# Patient Record
Sex: Female | Born: 1950 | Race: Black or African American | Hispanic: No | Marital: Single | Smoking: Current every day smoker
Health system: Southern US, Community
[De-identification: ages and names within clinical notes are randomized; demographics above are authoritative.]

## PROBLEM LIST (undated history)

## (undated) DIAGNOSIS — I10 Essential (primary) hypertension: Secondary | ICD-10-CM

---

## 2000-09-30 HISTORY — PX: COLONOSCOPY: SHX174

## 2013-12-03 ENCOUNTER — Other Ambulatory Visit: Payer: Self-pay

## 2013-12-03 DIAGNOSIS — Z1231 Encounter for screening mammogram for malignant neoplasm of breast: Secondary | ICD-10-CM

## 2014-02-03 ENCOUNTER — Ambulatory Visit
Admission: RE | Admit: 2014-02-03 | Discharge: 2014-02-03 | Disposition: A | Discharge status: Home / Self Care | Payer: 59 | Source: Ambulatory Visit

## 2014-02-03 DIAGNOSIS — Z1231 Encounter for screening mammogram for malignant neoplasm of breast: Secondary | ICD-10-CM

## 2014-03-25 ENCOUNTER — Emergency Department (HOSPITAL_BASED_OUTPATIENT_CLINIC_OR_DEPARTMENT_OTHER): Payer: 59

## 2014-03-25 ENCOUNTER — Emergency Department (HOSPITAL_BASED_OUTPATIENT_CLINIC_OR_DEPARTMENT_OTHER)
Admission: EM | Admit: 2014-03-25 | Discharge: 2014-03-25 | Disposition: A | Discharge status: Home / Self Care | Payer: 59 | Attending: Emergency Medicine | Admitting: Emergency Medicine

## 2014-03-25 ENCOUNTER — Encounter (HOSPITAL_BASED_OUTPATIENT_CLINIC_OR_DEPARTMENT_OTHER): Payer: Self-pay | Admitting: Emergency Medicine

## 2014-03-25 DIAGNOSIS — Z7982 Long term (current) use of aspirin: Secondary | ICD-10-CM | POA: Insufficient documentation

## 2014-03-25 DIAGNOSIS — Z880 Allergy status to penicillin: Secondary | ICD-10-CM | POA: Insufficient documentation

## 2014-03-25 DIAGNOSIS — M5416 Radiculopathy, lumbar region: Secondary | ICD-10-CM

## 2014-03-25 DIAGNOSIS — IMO0002 Reserved for concepts with insufficient information to code with codable children: Secondary | ICD-10-CM | POA: Insufficient documentation

## 2014-03-25 DIAGNOSIS — I10 Essential (primary) hypertension: Secondary | ICD-10-CM | POA: Insufficient documentation

## 2014-03-25 DIAGNOSIS — F17200 Nicotine dependence, unspecified, uncomplicated: Secondary | ICD-10-CM | POA: Insufficient documentation

## 2014-03-25 DIAGNOSIS — Z79899 Other long term (current) drug therapy: Secondary | ICD-10-CM | POA: Insufficient documentation

## 2014-03-25 HISTORY — DX: Essential (primary) hypertension: I10

## 2014-03-25 MED ORDER — HYDROCODONE-ACETAMINOPHEN 5-325 MG PO TABS: 2.0000 | ORAL_TABLET | ORAL | Status: DC | PRN

## 2014-03-25 MED ORDER — PREDNISONE 20 MG PO TABS: ORAL_TABLET | ORAL | Status: DC

## 2014-03-25 NOTE — ED Provider Notes (Signed)
CSN: [id number]     Arrival date & time 03/25/14  0711 History   First MD Initiated Contact with Patient 03/25/14 [phone]     Chief Complaint  Patient presents with  . Leg Pain     (Consider location/radiation/quality/duration/timing/severity/associated sxs/prior Treatment) HPI Comments: Patient presents to the ER for evaluation of leg pain. Patient is complaining of right thigh pain predominantly. She reports that at times the pain goes down into the lower leg and sometimes it hurts in her back on the right side. This worsens with movement. She has noticed occasional pain in the left leg, but not continuously or predictably. Patient denies any injury. She has not had any chest pain or difficulty breathing. She has noticed no skin changes. Patient does have a history of stents in the right leg.  Patient is a 63 y.o. female presenting with leg pain.  Leg Pain Associated symptoms: back pain     Past Medical History  Diagnosis Date  . Hypertension    History reviewed. No pertinent past surgical history. No family history on file. History  Substance Use Topics  . Smoking status: Current Every Day Smoker  . Smokeless tobacco: Not on file  . Alcohol Use: Yes   OB History   Grav Para Term Preterm Abortions TAB SAB Ect Mult Living                 Review of Systems  Musculoskeletal: Positive for back pain.  All other systems reviewed and are negative.     Allergies  Penicillins  Home Medications   Prior to Admission medications   Medication Sig Start Date End Date Taking? Authorizing Shahzad Thomann  aspirin EC 81 MG tablet Take 81 mg by mouth daily.   Yes Historical Hipolito Martinezlopez, MD  trandolapril-verapamil (TARKA) 1-240 MG per tablet Take 1 tablet by mouth daily.   Yes Historical Ashland Osmer, MD   BP 155/69  Pulse 73  Temp(Src) 97.9 F (36.6 C) (Oral)  Resp 20  Ht 5\' 5"  (1.651 m)  Wt 135 lb (61.236 kg)  BMI 22.47 kg/m2  SpO2 99% Physical Exam  Constitutional: She is oriented to  person, place, and time. She appears well-developed and well-nourished. No distress.  HENT:  Head: Normocephalic and atraumatic.  Right Ear: Hearing normal.  Left Ear: Hearing normal.  Nose: Nose normal.  Mouth/Throat: Oropharynx is clear and moist and mucous membranes are normal.  Eyes: Conjunctivae and EOM are normal. Pupils are equal, round, and reactive to light.  Neck: Normal range of motion. Neck supple.  Cardiovascular: Regular rhythm, S1 normal and S2 normal.  Exam reveals no gallop and no friction rub.   No murmur heard. Pulses:      Dorsalis pedis pulses are 2+ on the right side, and 2+ on the left side.  Pulmonary/Chest: Effort normal and breath sounds normal. No respiratory distress. She exhibits no tenderness.  Abdominal: Soft. Normal appearance and bowel sounds are normal. There is no hepatosplenomegaly. There is no tenderness. There is no rebound, no guarding, no tenderness at McBurney's point and negative Murphy's sign. No hernia.  Musculoskeletal: Normal range of motion.  Neurological: She is alert and oriented to person, place, and time. She has normal strength. No cranial nerve deficit or sensory deficit. Coordination normal. GCS eye subscore is 4. GCS verbal subscore is 5. GCS motor subscore is 6.  Skin: Skin is warm, dry and intact. No rash noted. No cyanosis.  Psychiatric: She has a normal mood and affect. Her speech is  normal and behavior is normal. Thought content normal.    ED Course  Procedures (including critical care time) Labs Review Labs Reviewed - No data to display  Imaging Review [place] Venous Img Lower Unilateral Right  03/25/2014   CLINICAL DATA:  Right lower extremity pain and edema for 3 weeks, history of smoking, evaluate for DVT, history of PAD and arterial stent within the right lower extremity  EXAM: RIGHT LOWER EXTREMITY VENOUS DOPPLER ULTRASOUND  TECHNIQUE: Gray-scale sonography with graded compression, as well as color Doppler and duplex ultrasound  were performed to evaluate the lower extremity deep venous systems from the level of the common femoral vein and including the common femoral, femoral, profunda femoral, popliteal and calf veins including the posterior tibial, peroneal and gastrocnemius veins when visible. The superficial great saphenous vein was also interrogated. Spectral Doppler was utilized to evaluate flow at rest and with distal augmentation maneuvers in the common femoral, femoral and popliteal veins.  COMPARISON:  None.  FINDINGS: Common Femoral Vein: No evidence of thrombus. Normal compressibility, respiratory phasicity and response to augmentation.  Saphenofemoral Junction: No evidence of thrombus. Normal compressibility and flow on color Doppler imaging.  Profunda Femoral Vein: No evidence of thrombus. Normal compressibility and flow on color Doppler imaging.  Femoral Vein: No evidence of thrombus. Normal compressibility, respiratory phasicity and response to augmentation.  Popliteal Vein: No evidence of thrombus. Normal compressibility, respiratory phasicity and response to augmentation.  Calf Veins: No evidence of thrombus. Normal compressibility and flow on color Doppler imaging.  Superficial Great Saphenous Vein: No evidence of thrombus. Normal compressibility and flow on color Doppler imaging.  Venous Reflux:  None.  Other Findings:  None.  IMPRESSION: No evidence of DVT within the right lower extremity.   Electronically Signed   By: Simonne ComeJohn  Watts M.D.   On: 03/25/2014 08:31     EKG Interpretation None      MDM   Final diagnoses:  None   lumbar radiculopathy  Patient presents with complaints of pain in the right leg. She reports a history of "stent" in the right leg. Symptoms are not convincing for claudication. She has easily palpable pulses in the distal portion of the leg. I am therefore not concerned for arterial insufficiency, thrombus, et Karie Sodacetera. Patient's symptoms are also somewhat atypical for DVT, but this was  considered. Ultrasound performed to rule out. No DVT is seen.  Patient is likely experiencing radicular low back pain causing the pain in the leg. We'll treat with analgesia, rest, and steroids. The patient should followup with her vascular surgeon for further evaluation since she has had previous surgery, although I believe that the symptoms were unlikely related.    Gilda Creasehristopher J. Pollina, MD 03/25/14 [phone]

## 2014-03-25 NOTE — ED Notes (Signed)
Pt c/o bilat leg pain x3-4 weeks with the right hurting more than the left. She sts the pain became considerably worse this morning.

## 2014-03-25 NOTE — Discharge Instructions (Signed)

## 2014-07-15 ENCOUNTER — Encounter (HOSPITAL_COMMUNITY): Admission: EM | Disposition: E | Payer: 59 | Source: Home / Self Care | Attending: Cardiology

## 2014-07-15 ENCOUNTER — Encounter (HOSPITAL_COMMUNITY): Payer: Self-pay | Admitting: Physician Assistant

## 2014-07-15 ENCOUNTER — Emergency Department (HOSPITAL_COMMUNITY): Payer: 59

## 2014-07-15 ENCOUNTER — Inpatient Hospital Stay (HOSPITAL_COMMUNITY): Payer: 59

## 2014-07-15 ENCOUNTER — Inpatient Hospital Stay (HOSPITAL_COMMUNITY)
Admission: EM | Admit: 2014-07-15 | Discharge: 2014-07-31 | DRG: 270 | Disposition: E | Payer: 59 | Attending: Cardiology | Admitting: Cardiology

## 2014-07-15 DIAGNOSIS — R55 Syncope and collapse: Secondary | ICD-10-CM | POA: Diagnosis present

## 2014-07-15 DIAGNOSIS — I2102 ST elevation (STEMI) myocardial infarction involving left anterior descending coronary artery: Secondary | ICD-10-CM

## 2014-07-15 DIAGNOSIS — I2582 Chronic total occlusion of coronary artery: Secondary | ICD-10-CM | POA: Diagnosis present

## 2014-07-15 DIAGNOSIS — I2109 ST elevation (STEMI) myocardial infarction involving other coronary artery of anterior wall: Principal | ICD-10-CM | POA: Diagnosis present

## 2014-07-15 DIAGNOSIS — E874 Mixed disorder of acid-base balance: Secondary | ICD-10-CM | POA: Diagnosis present

## 2014-07-15 DIAGNOSIS — D649 Anemia, unspecified: Secondary | ICD-10-CM

## 2014-07-15 DIAGNOSIS — J9601 Acute respiratory failure with hypoxia: Secondary | ICD-10-CM

## 2014-07-15 DIAGNOSIS — I70212 Atherosclerosis of native arteries of extremities with intermittent claudication, left leg: Secondary | ICD-10-CM | POA: Diagnosis present

## 2014-07-15 DIAGNOSIS — E876 Hypokalemia: Secondary | ICD-10-CM | POA: Diagnosis present

## 2014-07-15 DIAGNOSIS — E872 Acidosis, unspecified: Secondary | ICD-10-CM

## 2014-07-15 DIAGNOSIS — F17210 Nicotine dependence, cigarettes, uncomplicated: Secondary | ICD-10-CM | POA: Diagnosis present

## 2014-07-15 DIAGNOSIS — R570 Cardiogenic shock: Secondary | ICD-10-CM

## 2014-07-15 DIAGNOSIS — I7092 Chronic total occlusion of artery of the extremities: Secondary | ICD-10-CM | POA: Diagnosis present

## 2014-07-15 DIAGNOSIS — I2510 Atherosclerotic heart disease of native coronary artery without angina pectoris: Secondary | ICD-10-CM

## 2014-07-15 DIAGNOSIS — R202 Paresthesia of skin: Secondary | ICD-10-CM

## 2014-07-15 DIAGNOSIS — Z452 Encounter for adjustment and management of vascular access device: Secondary | ICD-10-CM

## 2014-07-15 DIAGNOSIS — J9602 Acute respiratory failure with hypercapnia: Secondary | ICD-10-CM | POA: Diagnosis present

## 2014-07-15 DIAGNOSIS — I10 Essential (primary) hypertension: Secondary | ICD-10-CM | POA: Diagnosis present

## 2014-07-15 DIAGNOSIS — I5021 Acute systolic (congestive) heart failure: Secondary | ICD-10-CM | POA: Diagnosis present

## 2014-07-15 DIAGNOSIS — Z880 Allergy status to penicillin: Secondary | ICD-10-CM

## 2014-07-15 DIAGNOSIS — R29898 Other symptoms and signs involving the musculoskeletal system: Secondary | ICD-10-CM

## 2014-07-15 DIAGNOSIS — I213 ST elevation (STEMI) myocardial infarction of unspecified site: Secondary | ICD-10-CM

## 2014-07-15 DIAGNOSIS — R739 Hyperglycemia, unspecified: Secondary | ICD-10-CM | POA: Diagnosis present

## 2014-07-15 HISTORY — PX: CARDIAC CATHETERIZATION: SHX172

## 2014-07-15 HISTORY — PX: LEFT HEART CATHETERIZATION WITH CORONARY ANGIOGRAM: SHX5451

## 2014-07-15 HISTORY — PX: PERCUTANEOUS CORONARY STENT INTERVENTION (PCI-S): SHX5485

## 2014-07-15 LAB — BASIC METABOLIC PANEL
Anion gap: 25 — ABNORMAL HIGH (ref 5–15)
BUN: 22 mg/dL (ref 6–23)
CALCIUM: 7.9 mg/dL — AB (ref 8.4–10.5)
CO2: 15 mEq/L — ABNORMAL LOW (ref 19–32)
Chloride: 101 mEq/L (ref 96–112)
Creatinine, Ser: 1.67 mg/dL — ABNORMAL HIGH (ref 0.50–1.10)
GFR, EST AFRICAN AMERICAN: 37 mL/min — AB (ref >90)
GFR, EST NON AFRICAN AMERICAN: 32 mL/min — AB (ref >90)
Glucose, Bld: 218 mg/dL — ABNORMAL HIGH (ref 70–99)
POTASSIUM: 3.6 meq/L — AB (ref 3.7–5.3)
Sodium: 141 mEq/L (ref 137–147)

## 2014-07-15 LAB — POCT I-STAT 3, ART BLOOD GAS (G3+)
ACID-BASE DEFICIT: 6 mmol/L — AB (ref 0.0–2.0)
Acid-base deficit: 23 mmol/L — ABNORMAL HIGH (ref 0.0–2.0)
Acid-base deficit: 11 mmol/L — ABNORMAL HIGH (ref 0.0–2.0)
Bicarbonate: 7.7 mEq/L — ABNORMAL LOW (ref 20.0–24.0)
Bicarbonate: 16.9 mEq/L — ABNORMAL LOW (ref 20.0–24.0)
Bicarbonate: 20.7 mEq/L (ref 20.0–24.0)
O2 SAT: 71 %
O2 Saturation: 98 %
O2 Saturation: 81 %
PCO2 ART: 42.8 mmHg (ref 35.0–45.0)
PH ART: 6.94 — AB (ref 7.350–7.450)
Patient temperature: 93.6
Patient temperature: 98.6
TCO2: 9 mmol/L (ref 0–100)
TCO2: 18 mmol/L (ref 0–100)
TCO2: 22 mmol/L (ref 0–100)
pCO2 arterial: 35.9 mmHg (ref 35.0–45.0)
pCO2 arterial: 49 mmHg — ABNORMAL HIGH (ref 35.0–45.0)
pH, Arterial: 7.188 — CL (ref 7.350–7.450)
pH, Arterial: 7.233 — ABNORMAL LOW (ref 7.350–7.450)
pO2, Arterial: 157 mmHg — ABNORMAL HIGH (ref 80.0–100.0)
pO2, Arterial: 48 mmHg — ABNORMAL LOW (ref 80.0–100.0)
pO2, Arterial: 44 mmHg — ABNORMAL LOW (ref 80.0–100.0)

## 2014-07-15 LAB — CBC
HCT: 27.9 % — ABNORMAL LOW (ref 36.0–46.0)
Hemoglobin: 8.8 g/dL — ABNORMAL LOW (ref 12.0–15.0)
MCH: 28.2 pg (ref 26.0–34.0)
MCHC: 31.5 g/dL (ref 30.0–36.0)
MCV: 89.4 fL (ref 78.0–100.0)
Platelets: 247 10*3/uL (ref 150–400)
RBC: 3.12 MIL/uL — ABNORMAL LOW (ref 3.87–5.11)
RDW: 15.7 % — AB (ref 11.5–15.5)
WBC: 14.8 10*3/uL — ABNORMAL HIGH (ref 4.0–10.5)

## 2014-07-15 LAB — I-STAT CHEM 8, ED
BUN: 28 mg/dL — ABNORMAL HIGH (ref 6–23)
CHLORIDE: 105 meq/L (ref 96–112)
Calcium, Ion: 1.17 mmol/L (ref 1.13–1.30)
Creatinine, Ser: 1.4 mg/dL — ABNORMAL HIGH (ref 0.50–1.10)
GLUCOSE: 177 mg/dL — AB (ref 70–99)
HCT: 21 % — ABNORMAL LOW (ref 36.0–46.0)
Hemoglobin: 7.1 g/dL — ABNORMAL LOW (ref 12.0–15.0)
POTASSIUM: 4.3 meq/L (ref 3.7–5.3)
Sodium: 136 mEq/L — ABNORMAL LOW (ref 137–147)
TCO2: 15 mmol/L (ref 0–100)

## 2014-07-15 LAB — I-STAT CG4 LACTIC ACID, ED: LACTIC ACID, VENOUS: 12.98 mmol/L — AB (ref 0.5–2.2)

## 2014-07-15 LAB — CBC WITH DIFFERENTIAL/PLATELET
BASOS ABS: 0 10*3/uL (ref 0.0–0.1)
BASOS PCT: 0 % (ref 0–1)
EOS PCT: 1 % (ref 0–5)
Eosinophils Absolute: 0.2 10*3/uL (ref 0.0–0.7)
HCT: 22.6 % — ABNORMAL LOW (ref 36.0–46.0)
Hemoglobin: 7 g/dL — ABNORMAL LOW (ref 12.0–15.0)
LYMPHS PCT: 29 % (ref 12–46)
Lymphs Abs: 4.6 10*3/uL — ABNORMAL HIGH (ref 0.7–4.0)
MCH: 28.4 pg (ref 26.0–34.0)
MCHC: 30.5 g/dL (ref 30.0–36.0)
MCV: 93 fL (ref 78.0–100.0)
Monocytes Absolute: 1.1 10*3/uL — ABNORMAL HIGH (ref 0.1–1.0)
Monocytes Relative: 7 % (ref 3–12)
NEUTROS ABS: 10.1 10*3/uL — AB (ref 1.7–7.7)
Neutrophils Relative %: 63 % (ref 43–77)
PLATELETS: 273 10*3/uL (ref 150–400)
RBC: 2.43 MIL/uL — ABNORMAL LOW (ref 3.87–5.11)
RDW: 16 % — AB (ref 11.5–15.5)
WBC: 16 10*3/uL — ABNORMAL HIGH (ref 4.0–10.5)

## 2014-07-15 LAB — APTT
APTT: 23 s — AB (ref 24–37)
aPTT: 107 seconds — ABNORMAL HIGH (ref 24–37)

## 2014-07-15 LAB — ETHANOL

## 2014-07-15 LAB — COMPREHENSIVE METABOLIC PANEL
ALT: 49 U/L — ABNORMAL HIGH (ref 0–35)
AST: 77 U/L — ABNORMAL HIGH (ref 0–37)
Albumin: 2.8 g/dL — ABNORMAL LOW (ref 3.5–5.2)
Alkaline Phosphatase: 51 U/L (ref 39–117)
Anion gap: 25 — ABNORMAL HIGH (ref 5–15)
BILIRUBIN TOTAL: 0.3 mg/dL (ref 0.3–1.2)
BUN: 22 mg/dL (ref 6–23)
CALCIUM: 8.6 mg/dL (ref 8.4–10.5)
CO2: 10 meq/L — AB (ref 19–32)
Chloride: 104 mEq/L (ref 96–112)
Creatinine, Ser: 1.4 mg/dL — ABNORMAL HIGH (ref 0.50–1.10)
GFR, EST AFRICAN AMERICAN: 45 mL/min — AB (ref >90)
GFR, EST NON AFRICAN AMERICAN: 39 mL/min — AB (ref >90)
Glucose, Bld: 264 mg/dL — ABNORMAL HIGH (ref 70–99)
Potassium: 3.9 mEq/L (ref 3.7–5.3)
Sodium: 139 mEq/L (ref 137–147)
Total Protein: 6.2 g/dL (ref 6.0–8.3)

## 2014-07-15 LAB — ABO/RH
ABO/RH(D): O POS
ABO/RH(D): O POS

## 2014-07-15 LAB — PROTIME-INR
INR: 1.36 (ref 0.00–1.49)
INR: 2.89 — ABNORMAL HIGH (ref 0.00–1.49)
PROTHROMBIN TIME: 30.5 s — AB (ref 11.6–15.2)
Prothrombin Time: 16.9 seconds — ABNORMAL HIGH (ref 11.6–15.2)

## 2014-07-15 LAB — TYPE AND SCREEN
ABO/RH(D): O POS
ANTIBODY SCREEN: NEGATIVE

## 2014-07-15 LAB — PREPARE RBC (CROSSMATCH)

## 2014-07-15 LAB — HEPARIN LEVEL (UNFRACTIONATED)
Heparin Unfractionated: <0.1 IU/mL — ABNORMAL LOW (ref 0.30–0.70)
Heparin Unfractionated: <0.1 IU/mL — ABNORMAL LOW (ref 0.30–0.70)

## 2014-07-15 LAB — TROPONIN I: TROPONIN I: 2.71 ng/mL — AB (ref <0.30)

## 2014-07-15 LAB — I-STAT TROPONIN, ED: Troponin i, poc: 1.92 ng/mL (ref 0.00–0.08)

## 2014-07-15 LAB — POCT ACTIVATED CLOTTING TIME: ACTIVATED CLOTTING TIME: 692 s

## 2014-07-15 SURGERY — LEFT HEART CATHETERIZATION WITH CORONARY ANGIOGRAM
Anesthesia: LOCAL

## 2014-07-15 MED ORDER — PANTOPRAZOLE SODIUM 40 MG IV SOLR
40.0000 mg | Freq: Every day | INTRAVENOUS | Status: DC
  Filled 2014-07-15: qty 40

## 2014-07-15 MED ORDER — ROCURONIUM BROMIDE 50 MG/5ML IV SOLN
INTRAVENOUS | Status: AC
  Filled 2014-07-15: qty 2

## 2014-07-15 MED ORDER — VASOPRESSIN 20 UNIT/ML IJ SOLN
0.0300 [IU]/min | INTRAVENOUS | Status: DC
  Administered 2014-07-15: 0.03 [IU]/min via INTRAVENOUS
  Filled 2014-07-15: qty 2

## 2014-07-15 MED ORDER — MILRINONE IN DEXTROSE 20 MG/100ML IV SOLN
0.2500 ug/kg/min | INTRAVENOUS | Status: DC
  Administered 2014-07-15: 0.25 ug/kg/min via INTRAVENOUS
  Filled 2014-07-15: qty 100

## 2014-07-15 MED ORDER — SODIUM CHLORIDE 0.9 % IV SOLN
1.0000 ug/kg/min | INTRAVENOUS | Status: DC
  Filled 2014-07-15 (×2): qty 20

## 2014-07-15 MED ORDER — ASPIRIN 300 MG RE SUPP
300.0000 mg | Freq: Once | RECTAL | Status: DC
  Filled 2014-07-15: qty 1

## 2014-07-15 MED ORDER — TICAGRELOR 90 MG PO TABS
180.0000 mg | ORAL_TABLET | Freq: Once | ORAL | Status: AC
  Administered 2014-07-15: 180 mg via ORAL
  Filled 2014-07-15 (×2): qty 2

## 2014-07-15 MED ORDER — LIDOCAINE HCL (PF) 1 % IJ SOLN
INTRAMUSCULAR | Status: AC
  Filled 2014-07-15: qty 5

## 2014-07-15 MED ORDER — DEXTROSE 5 % IV SOLN
0.0000 ug/min | INTRAVENOUS | Status: DC
  Administered 2014-07-15: 50 ug/min via INTRAVENOUS
  Filled 2014-07-15: qty 16

## 2014-07-15 MED ORDER — SODIUM CHLORIDE 0.9 % IV SOLN
25.0000 ug/h | INTRAVENOUS | Status: DC
  Filled 2014-07-15 (×2): qty 50

## 2014-07-15 MED ORDER — LIDOCAINE HCL (CARDIAC) 20 MG/ML IV SOLN
INTRAVENOUS | Status: AC
  Filled 2014-07-15: qty 5

## 2014-07-15 MED ORDER — NOREPINEPHRINE BITARTRATE 1 MG/ML IV SOLN
0.0000 ug/min | INTRAVENOUS | Status: DC
  Filled 2014-07-15: qty 4

## 2014-07-15 MED ORDER — MIDAZOLAM HCL 5 MG/ML IJ SOLN
1.0000 mg/h | INTRAMUSCULAR | Status: DC
  Filled 2014-07-15 (×2): qty 10

## 2014-07-15 MED ORDER — ETOMIDATE 2 MG/ML IV SOLN
INTRAVENOUS | Status: AC
  Filled 2014-07-15: qty 20

## 2014-07-15 MED ORDER — STERILE WATER FOR INJECTION IV SOLN
INTRAVENOUS | Status: DC
  Administered 2014-07-15: 23:00:00 via INTRAVENOUS
  Filled 2014-07-15 (×3): qty 850

## 2014-07-15 MED ORDER — HEPARIN (PORCINE) IN NACL 100-0.45 UNIT/ML-% IJ SOLN
700.0000 [IU]/h | INTRAMUSCULAR | Status: DC
  Filled 2014-07-15: qty 250

## 2014-07-15 MED ORDER — HEPARIN (PORCINE) IN NACL 100-0.45 UNIT/ML-% IJ SOLN
600.0000 [IU]/h | INTRAMUSCULAR | Status: DC
  Filled 2014-07-15: qty 250

## 2014-07-15 MED ORDER — SUCCINYLCHOLINE CHLORIDE 20 MG/ML IJ SOLN
INTRAMUSCULAR | Status: AC
  Filled 2014-07-15: qty 1

## 2014-07-15 MED ORDER — SODIUM BICARBONATE 8.4 % IV SOLN
100.0000 meq | Freq: Once | INTRAVENOUS | Status: AC
  Administered 2014-07-15: 100 meq via INTRAVENOUS
  Filled 2014-07-15: qty 100

## 2014-07-15 MED ORDER — DEXTROSE 5 % IV SOLN
0.5000 ug/min | INTRAVENOUS | Status: DC
  Administered 2014-07-15: 1 ug/min via INTRAVENOUS
  Filled 2014-07-15: qty 1

## 2014-07-15 MED ORDER — SODIUM CHLORIDE 0.9 % IV SOLN: Freq: Once | INTRAVENOUS | Status: DC

## 2014-07-15 MED ORDER — INSULIN ASPART 100 UNIT/ML ~~LOC~~ SOLN: 1.0000 [IU] | SUBCUTANEOUS | Status: DC

## 2014-07-15 MED ORDER — ATORVASTATIN CALCIUM 80 MG PO TABS
80.0000 mg | ORAL_TABLET | Freq: Every day | ORAL | Status: DC
  Filled 2014-07-15 (×2): qty 1

## 2014-07-15 MED ORDER — ASPIRIN 81 MG PO CHEW: 324.0000 mg | CHEWABLE_TABLET | Freq: Once | ORAL | Status: DC

## 2014-07-15 MED ORDER — SODIUM CHLORIDE 0.9 % IV SOLN: 1.0000 mL/kg/h | INTRAVENOUS | Status: AC

## 2014-07-15 MED ORDER — ASPIRIN 81 MG PO CHEW: 81.0000 mg | CHEWABLE_TABLET | Freq: Every day | ORAL | Status: DC

## 2014-07-15 MED ORDER — PANTOPRAZOLE SODIUM 40 MG IV SOLR
40.0000 mg | Freq: Once | INTRAVENOUS | Status: AC
  Administered 2014-07-15: 40 mg via INTRAVENOUS
  Filled 2014-07-15: qty 40

## 2014-07-15 MED ORDER — HEPARIN BOLUS VIA INFUSION
3500.0000 [IU] | Freq: Once | INTRAVENOUS | Status: DC
  Filled 2014-07-15: qty 3500

## 2014-07-15 MED ORDER — SODIUM CHLORIDE 0.9 % IV BOLUS (SEPSIS)
1000.0000 mL | Freq: Once | INTRAVENOUS | Status: AC
  Administered 2014-07-15: 1000 mL via INTRAVENOUS

## 2014-07-15 MED ORDER — TIROFIBAN HCL IV 5 MG/100ML
0.1500 ug/kg/min | INTRAVENOUS | Status: DC
  Filled 2014-07-15 (×3): qty 100

## 2014-07-15 MED ORDER — TICAGRELOR 90 MG PO TABS: 90.0000 mg | ORAL_TABLET | Freq: Two times a day (BID) | ORAL | Status: DC

## 2014-07-15 MED ORDER — HEPARIN SODIUM (PORCINE) 1000 UNIT/ML IJ SOLN: 4000.0000 [IU] | Freq: Once | INTRAMUSCULAR | Status: DC

## 2014-07-15 MED FILL — Medication: Qty: 1 | Status: AC

## 2014-07-15 NOTE — Progress Notes (Addendum)
Pt intubated per  ED-MD. Pt transfered to [facility] immediately after intubation per EMS. No vent was set up. Pt being bagged to [facility] per RN and EMS. [facility] charge RT has been notified. Pt being sent to cath lab as well.

## 2014-07-15 NOTE — ED Notes (Signed)
MD at bedside. Dr. Zavitz at bedside.  

## 2014-07-15 NOTE — Code Documentation (Addendum)
Meds given:  1641 1ml epi                      1641 1ml epi                       1642 20 mg etom 1643 patient intubated                      1642 150 mg succ                      1643 1 ml epi                      1646 1 ml epi                      1651 1 ml epi 1652 blood administration started 1654 pt transferred out of department via [facility] to cath lab

## 2014-07-15 NOTE — Progress Notes (Signed)
Chaplain responded to page from 2S for family requesting prayer. Chaplain facilitated storytelling. Advocated for family to pray over patient and chaplain prayed with family over mother. Family expressed that they know God can do miracles but they want "God's will" to be done. Family remarked how strong their mother's faith is. Chaplain made family aware of her services. Page chaplain if needed.   07/25/2014 2100  Clinical Encounter Type  Visited With Patient and family together  Visit Type Initial  Referral From Nurse  Spiritual Encounters  Spiritual Needs Ritual;Emotional;Grief support  Stress Factors  Family Stress Factors Loss  Doryce Mcgregory, Mayer MaskerCourtney F, Chaplain 07/23/2014 9:23 PM

## 2014-07-15 NOTE — CV Procedure (Signed)
Cardiac Catheterization Procedure Note  Name: Denise Oconnor MRN: [id number] DOB: [birth year]  Procedure: Left Heart Cath, Selective Coronary Angiography, LV angiography,  PTCA/Stent of LAD and second diagonal bifurcation lesion. IABP placement.  Indication: 63 yo BF [facility] with an acute anterior STEMI and cardiogenic shock. There was significant delay in transport due to concerns over recent GI bleed and severe anemia. Patient also required intubation and stabilization of shock prior to transfer.    Diagnostic Procedure Details: The right groin was prepped, draped, and anesthetized with 1% lidocaine. Using the modified Seldinger technique, a 6 French sheath was introduced into the right femoral artery. Standard Judkins catheters were used for selective coronary angiography and left ventricular pressure measurement. Catheter exchanges were performed over a wire.  The diagnostic procedure was well-tolerated without immediate complications.  PROCEDURAL FINDINGS Hemodynamics: AO 90/60   Coronary angiography: Coronary dominance: right  Left mainstem: Normal  Left anterior descending (LAD): The LAD was a large vessel. There was a 99% stenosis at the takeoff of the first diagonal proximally with TIMI 1 flow. The first diagonal was a modest branch with mild ostial disease. The second diagonal was a larger branch with a 90% proximal stenosis.   Left circumflex (LCx): Mild irregularities   Right coronary artery (RCA): 40% ostial stenosis. Otherwise no obstructive disease.   Left ventriculography: Not done  PCI Procedure Note:  Following the diagnostic procedure, the decision was made to proceed with PCI.  Weight-based bivalirudin was given for anticoagulation. Since the patient was unable to receive oral antiplatelet therapy she was given IV bolus and infusion of Aggrastat. Once a therapeutic ACT was achieved, a 6 [place]French XBLAD 3.5  guide catheter was inserted.  A prowater  coronary guidewire was used to cross the lesion in the LAD.  A second prowater was passed into the second diagonal. The lesion in the diagonal was predilated with a 2.25 mm balloon. There was still significant stenosis and since this was a fairly large branch we decided to stent the diagonal as well as the LAD.  The lesion was then stented with a 2.25 x 12 mm Promus stent. The LAD was predilated with the 2.25 mm balloon as well. It was then stented with a 3.0 x 20 mm Promus stent. The diagonal wire was then withdrawn and passed back into the second diagonal through the stent struts. This was done with a Choice PT wire.  The lesions were then post dilated using a kissing balloon technique with a 3.0 mm noncompliant balloon in the LAD and a 2.25 mm noncompliant balloon in the diagonal.   Following PCI, there was 0% residual stenosis and TIMI-3 flow. The first diagonal was still patent. Final angiography confirmed an excellent result. There was improved flow into the distal LAD.   At the end of the PCI procedure the patient's BP dropped into the 60s systolic. LV pressure was measured at 63/37 mm Hg. She was in cardiogenic shock. She was started on IV Levophed and given 2 amps of bicarb for severe metabolic acidosis. An IABP was placed in exchange for the right femoral sheath.  She had good augmentation of pressures. The patient was transferred to the ICU area for further monitoring and treatment.  PCI Data: Vessel #1 - LAD/Segment - proximal  Vessel #2- second diagonal Percent Stenosis (pre)  99% and 90% TIMI-flow 1 Stent 3.0 x 20 mm Promus in LAD, 2.25 x 12 mm Promus in diagonal with kissing balloon angioplasty. Percent Stenosis (  post) 0% TIMI-flow (post) 3  Final Conclusions:   1. Severe single vessel obstructive CAD 2. Cardiogenic shock. 3. Successful bifurcation stenting of the LAD and second diagonal with DES x 2.   Recommendations: Will transition from angiomax to IV heparin and leave IABP in  place. Will give ASA suppository. Once NG in place will give loading dose of Brilinta. Will continue Aggrastat IV until Brilinta on board. Continue pressor and IABP support for shock. CCM to manage vent.   Peter SwazilandJordan, MDFACC  04/21/2014, 7:42 PM

## 2014-07-15 NOTE — Procedures (Signed)
Central Venous Catheter Insertion Procedure Note Denise ReadingWindy Oconnor [id number] [birth year]  Procedure: Insertion of Central Venous Catheter Indications: Drug and/or fluid administration  Procedure Details Consent: Unable to obtain consent because of altered level of consciousness. Time Out: Verified patient identification, verified procedure, site/side was marked, verified correct patient position, special equipment/implants available, medications/allergies/relevent history reviewed, required imaging and test results available.  Performed  Maximum sterile technique was used including antiseptics, cap, gloves, gown, hand hygiene, mask and sheet. Skin prep: Chlorhexidine; local anesthetic administered A antimicrobial bonded/coated triple lumen catheter was placed in the right internal jugular vein using the Seldinger technique.  Evaluation Blood flow good Complications: Complications of R neck hematoma. Manual pressure held for 30 minutes. Patient did tolerate procedure well. Chest X-ray ordered to verify placement.  CXR: normal.  Denise Meresaniel Denise Wirz MD 07/05/2014, 8:32 PM

## 2014-07-15 NOTE — H&P (Signed)
Admit date: 07/08/2014 Primary Physician  Jackie PlumSEI-BONSU,GEORGE, MD Primary Cardiologist  New  CC: Chest pain  HPI: 63 yo female with recent (06/30/2014) [facility]  [address]  [place], [place] [address]  [phone]  [phone] fax  Who [facility] with what appears to be cardiogenic shock with anterior lateral ST segment elevation myocardial infarction with a very complicated medical history.  Apparently she has been worked up in the past several weeks for rectal bleeding and her daughter states that she has had colonoscopy and that this was normal, she stopped bleeding. Her daughter states that last week she fainted while at work. Today, while at work she had symptoms of weakness, presyncope and her initial vital signs were 62/38. She complains also of some weakness in her left leg as well as paresthesias but no other strokelike symptoms.   When I arrived in the emergency department, she was in extreme distress, respiratory and it was challenging for me to ascertain whether or not she was having chest discomfort. She was writhing around in the bed. Because of acute respiratory failure, she was intubated by Dr. Jodi MourningZavitz.  Cardiac risk factors include smoking. Also based upon history given by daughter, she has a "stent "in her right leg. Her daughter spoke of blood clot. Obtaining history from [place].  She has also been taking Megace to help with dietary enhancement.  While in the ER, bedside echocardiogram demonstrated anterolateral akinesis with ejection fraction likely in the 30% range. Inferior wall seemed to function normally. Norepinephrine was begun because of hypotension, presumed cardiogenic shock. Lactate was 12. Hemoglobin was 7.1. She did not demonstrate any active signs of bleeding. Creatinine was 1.4, troponin 1.9.  Rectal aspirin has been ordered.     PMH:   Past  Medical History  Diagnosis Date  . Hypertension     PSH:   Past Surgical History  Procedure Laterality Date  . Colonoscopy  2002   Allergies:  Penicillins Prior to Admit Meds:   Prior to Admission medications   Medication Sig Start Date End Date Taking? Authorizing Provider  aspirin EC 81 MG tablet Take 81 mg by mouth daily.    Historical Provider, MD  megestrol (MEGACE) 40 MG/ML suspension Take 400 mg by mouth daily.  07/05/14   Historical Provider, MD  pantoprazole (PROTONIX) 40 MG tablet Take 40 mg by mouth daily.  06/30/14   Historical Provider, MD  trandolapril-verapamil (TARKA) 1-240 MG per tablet Take 1 tablet by mouth daily.    Historical Provider, MD  triamterene-hydrochlorothiazide (DYAZIDE) 37.5-25 MG per capsule Take 1 capsule by mouth daily.  06/20/14   Historical Provider, MD  verapamil (CALAN-SR) 180 MG CR tablet Take 180 mg by mouth daily.  07/09/14   Historical Provider, MD   Fam HX:   No family history available due to patient condition. Social HX:    History   Social History  . Marital Status: Single    Spouse Name: N/A    Number of Children: N/A  . Years of Education: N/A   Occupational History  . Not on file.   Social History Main Topics  . Smoking status: Current Every Day Smoker  . Smokeless tobacco: Not on file  . Alcohol Use: Yes  . Drug Use: Not on file  . Sexual Activity: Not on file   Other Topics Concern  . Not on file   Social History Narrative  .  No narrative on file     ROS:  Challenging to obtain a clear review of systems. All 11 ROS were addressed and are negative except what is stated in the HPI   Physical Exam: Blood pressure 71/49, pulse 84, temperature 98.2 F (36.8 C), temperature source Rectal, resp. rate 18, SpO2 98.00%.   General: Severe acute distress, respiratory, writhing around. Unable to localize any specific pain Head: Eyes PERRLA, No xanthomas.   Normal cephalic and atramatic  Lungs: Diffuse crackles heard  bilaterally. Face mask oxygen initially then intubation. Heart: Increased heart rate. Pulses are 2+ radial, cool extremities. No murmurs, rubs or gallops.             No carotid bruit. No JVD.  No abdominal bruits. Abdomen: Bowel sounds are positive, abdomen soft and non-tender without masses or                 Hernia's noted. No hepatosplenomegaly. Msk:  Back normal, Normal strength and tone for age. Extremities:  No clubbing, cyanosis or edema.  DP +1 Neuro: Alert but fairly noncooperative in distress. GU: Deferred Rectal: Deferred Psych:  Unable to fully assess.      EKG:  Sinus rhythm with ST segment lateral and anterior elevation. STEMI. Personally viewed.  ASSESSMENT/PLAN:   63 year old female with anterolateral ST elevation myocardial infarction with acute systolic heart failure, acute respiratory failure, cardiogenic shock, likely peripheral vascular disease with "right leg stent ", smoker with anemia, hemoglobin 7.1, prior GI bleed.  1. Anterolateral ST elevation myocardial infarction- currently appears to be in cardiogenic shock. Norepinephrine for pressure support. Emergency cardiac catheterization. Lactate 12 demonstrating perfusion malady. Blood is currently being transfused as well. Aspirin will be administered. Based upon cardiac catheterization, we will weigh the risk versus benefit of full anticoagulation. Obviously if she does have severe coronary artery disease or occlusion in the setting of cardiogenic shock, we will need to proceed with coronary intervention. I discussed this with her daughter and son. They realize the gravity of the situation. I have also discussed with Dr. Peter SwazilandJordan, interventional cardiology.  If coronary anatomy unremarkable, certainly there could be other etiologies for her shock. She is currently receiving blood although she does not show any signs of active GI hemorrhage. We will try to keep her hemoglobin greater than 8.  Currently, records are  being obtained from [place].  2. Anemia-transfusion as above. Previous GI bleed. According to her daughter, she has not had any further bleeding a few weeks. They could not find any obvious source according to her daughter. More records to come.  3. Tobacco cessation  4. Acute respiratory failure-critical care medicine to assist.  5. Cardiogenic shock - norepinephrine.  Critical care time 60 minutes with patient, data collection, communication between physicians and team and family.  Theodore Demarkhonda Barrett, PA-C  08/29/2014  4:25 PM  .

## 2014-07-15 NOTE — Progress Notes (Addendum)
Calle dto patients room for code. End tidal hooked up and reading was 43 while coding. Patient expired at 23:05.

## 2014-07-15 NOTE — ED Notes (Signed)
Per EMS-[facility] week for [facility]. Found stomach ulcers and was admitted for observation. Went back to work today-"didn't feel well-felt hot and weak, SOB." Laid down on the floor in the bathroom and splashed water on her face. Did not lose consciousness or hit head. VS: BP 62/38 (before fluids) and most recent was 81/61 (after 100 cc fluids) SpO2 100% on RA  HR 80s strong, regular CBG 277. Placed on 2L O2 [place] for comfort. 18 G PIV placed in left AC. Given approx 100 cc NS. A&Ox4. Moving all extremities equally.

## 2014-07-15 NOTE — ED Provider Notes (Signed)
CSN: [id number]     Arrival date & time Aug 28, 2014  1515 History   First MD Initiated Contact with Patient Aug 28, 2014 1520     Chief Complaint  Patient presents with  . Rectal Bleeding  . Hypotension     (Consider location/radiation/quality/duration/timing/severity/associated sxs/prior Treatment) HPI   Denise Oconnor is a(n) 63 y.o. female who presents to the ED with weakness and GI bleed. Patient was [facility], dx with bleeding ulcers and admitted for observation. Patient was at work today and had sxs of presyncope and weakess. EMS was called and found initial vitals to be 62/38 . cbg of 277. Patient was given a liter of saline prior to arrival with BP rising to 81/61. Arrives on 2L 02 via [place]. She has a hx of hypertension and is currently taking 3 antihypertensives and took them today. Upon arrival the patient began c/o severe wekaness, pain and paresthesia of the L leg.  Denies facial asymmetry, difficulty with speech, headache, visual disturbances or vertigo. Denies fevers, chills, myalgias, arthralgias. Denies DOE, SOB,chest pain, chest tightness or pressure, radiation to left arm, jaw or back, or diaphoresis. Denies dysuria, flank pain, suprapubic pain, frequency, urgency, or hematuria. . Denies abdominal pain, nausea, vomiting, diarrhea or constipation.      Past Medical History  Diagnosis Date  . Hypertension    No past surgical history on file. No family history on file. History  Substance Use Topics  . Smoking status: Current Every Day Smoker  . Smokeless tobacco: Not on file  . Alcohol Use: Yes   OB History   Grav Para Term Preterm Abortions TAB SAB Ect Mult Living                 Review of Systems  Ten systems reviewed and are negative for acute change, except as noted in the HPI.    Allergies  Penicillins  Home Medications   Prior to Admission medications   Medication Sig Start Date End Date Taking? Authorizing Provider  aspirin EC 81 MG  tablet Take 81 mg by mouth daily.    Historical Provider, MD  megestrol (MEGACE) 40 MG/ML suspension  07/05/14   Historical Provider, MD  pantoprazole (PROTONIX) 40 MG tablet  06/30/14   Historical Provider, MD  trandolapril-verapamil (TARKA) 1-240 MG per tablet Take 1 tablet by mouth daily.    Historical Provider, MD  triamterene-hydrochlorothiazide (DYAZIDE) 37.5-25 MG per capsule  06/20/14   Historical Provider, MD  verapamil (CALAN-SR) 180 MG CR tablet  07/09/14   Historical Provider, MD   There were no vitals taken for this visit. Physical Exam  Nursing note and vitals reviewed. Constitutional: She is oriented to person, place, and time. She appears well-developed. She appears distressed.  HENT:  Head: Normocephalic and atraumatic.  Eyes: EOM are normal. Pupils are equal, round, and reactive to light. Right eye exhibits no discharge. Left eye exhibits no discharge.  Neck: Neck supple. No JVD present.  Cardiovascular: Normal rate and regular rhythm.  Exam reveals no friction rub.   No palpable dp/pt pulses  Pulmonary/Chest: Effort normal and breath sounds normal.  Abdominal: Soft. Bowel sounds are normal. She exhibits no distension and no mass. There is no tenderness.  Neurological: She is alert and oriented to person, place, and time.  Speech is clear prefers to answer only yes or no, follows commands Major Cranial nerves without deficit, no facial droop Over all weakness. Patient slumped over in the bed when sat up. She has  marked weakness of the left leg in comparison to the right however is able to flex and extend at the hip. Marked weakness with plantar and dorsiflexion. Impaired sensation on the left. Rapid alternating movements and gait are deffered dueto patient acuity  Skin: Skin is dry. She is not diaphoretic.  Very cool and dry mottled    ED Course  Procedures (including critical care time) Labs Review Labs Reviewed  I-STAT CHEM 8, ED - Abnormal; Notable for the  following:    Sodium 136 (*)    BUN 28 (*)    Creatinine, Ser 1.40 (*)    Glucose, Bld 177 (*)    Hemoglobin 7.1 (*)    HCT 21.0 (*)    All other components within normal limits  I-STAT TROPOININ, ED - Abnormal; Notable for the following:    Troponin i, poc 1.92 (*)    All other components within normal limits  I-STAT CG4 LACTIC ACID, ED - Abnormal; Notable for the following:    Lactic Acid, Venous 12.98 (*)    All other components within normal limits  CBC WITH DIFFERENTIAL  OCCULT BLOOD X 1 CARD TO LAB, STOOL  ETHANOL  PROTIME-INR  APTT  DIFFERENTIAL  COMPREHENSIVE METABOLIC PANEL  URINE RAPID DRUG SCREEN (HOSP PERFORMED)  URINALYSIS, ROUTINE W REFLEX MICROSCOPIC  TROPONIN I  HEPARIN LEVEL (UNFRACTIONATED)  I-STAT TROPOININ, ED  TYPE AND SCREEN  PREPARE RBC (CROSSMATCH)    Imaging Review No results found.   EKG Interpretation None     CRITICAL CARE Performed by: Arthor Captain   Total critical care time: 90 min  Critical care time was exclusive of separately billable procedures and treating other patients.  Critical care was necessary to treat or prevent imminent or life-threatening deterioration.  Critical care was time spent personally by me on the following activities: development of treatment plan with patient and/or surrogate as well as nursing, discussions with consultants, evaluation of patient's response to treatment, examination of patient, obtaining history from patient or surrogate, ordering and performing treatments and interventions, ordering and review of laboratory studies, ordering and review of radiographic studies, pulse oximetry and re-evaluation of patient's condition.    MDM   Final diagnoses:  Cardiogenic shock  ST elevation myocardial infarction (STEMI), unspecified artery  Lactic acidosis  Paresthesia  Anemia, unspecified anemia type  Left leg weakness   3:47 PM BP 62/40  Pulse 84  Temp(Src) 98.2 F (36.8 C) (Rectal)  Resp 18   SpO2 98% Patient here with hypotension. EKG is concerning for Acute MI. She has ahd acute onset Left leg weakess and paresthesia since arriving. She denies pain and her FOBT is negative. DDX incudes acute disssection.  AMI,.Stroke. Patient seen in shared visit with attending physician.   3:50 ekg concerning for acute anterolateral MI. ? Hypoperfusion vs. Severe anemia. Medical records ordered. Consult to cards/ neuro  4:00 patient c/o sob  Moved to resus. Bedside US shows weak EF,IVC appears dilated. Difficult access. Consult to cards/ neuro/ inctesivist complete. Patiet will need transfer to [facility], awaiting labs, want baseline hgb.    4:15 Patient c/o worsenig SOB after bolus. JVD present Appears to be more agitated,  Acute O-  Unit ordered   Chem 8 wth hgb of 7,  Lactate of 13 We have decided not to give Heparin. DrSkains present and asks for rectal ASA, Dr. Jodi Mourning will intubate.    Patient intubated, Her troponin 1.92 Feel she has AMI, cardiogenic shock and go to cath lab. Left leg  paresthesia likely due to acute claudication as she has a history of R leg stent. Transferred to cath lab in critical conditions.       Arthor CaptainAbigail Calvina Liptak, PA-C Apr 10, 2014 [phone]

## 2014-07-15 NOTE — Progress Notes (Signed)
   63 y/o woman brought from cath lab after anterior STEMI. S/p stenting of LAD and diagonal. Placement of IABP for cardiogenic shock.  On arrival to 2S patient intubated and sedated. MAPs 70s on IABP. Hypothermic at 91 degrees. Placed on [employer].  RIJ central line placed. Co-ox drawn 37%.   Milrinone added to levophed.   Will transduce CVPs. Continue agrrestat until Brilinta given from OG  Discussed with family.   The patient is critically ill with multiple organ systems failure and requires high complexity decision making for assessment and support, frequent evaluation and titration of therapies, application of advanced monitoring technologies and extensive interpretation of multiple databases.   Critical Care Time devoted to patient care services described in this note is 35 Minutes.   Truman HaywardDaniel Octavis Sheeler,MD 8:38 PM

## 2014-07-15 NOTE — ED Notes (Signed)
Pt c/o left leg pain and weakness, sts can't breathe, has JVD. Pt placed on NRB

## 2014-07-15 NOTE — Progress Notes (Addendum)
ANTICOAGULATION CONSULT NOTE - Initial Consult  Pharmacy Consult for Heparin Infusion Indication: chest pain/ACS  Allergies  Allergen Reactions  . Penicillins Anaphylaxis    Patient Measurements: Weight 61.2kg (03/25/14) New weight pending  Vital Signs: Temp: 98.2 F (36.8 C) (10/16 1532) Temp Source: Rectal (10/16 1532) BP: 71/49 mmHg (10/16 1559) Pulse Rate: 84 (10/16 1532)  Labs: No results found for this basename: HGB, HCT, PLT, APTT, LABPROT, INR, HEPARINUNFRC, CREATININE, CKTOTAL, CKMB, TROPONINI,  in the last 72 hours  CrCl is unknown because no creatinine reading has been taken.   Medical History: Past Medical History  Diagnosis Date  . Hypertension     Assessment: [age] yoF with h/o bleeding ulcers, HTN. Presents to ED with weakness and GI bleed. EKG is concerning for Acute MI. Pharmacy consulted to dose Heparin infusion for ACS/STEMI.    Baseline PTT, PT/INR, CBC pending.   Goal of Therapy:  Heparin level 0.3-0.7 units/ml (would prefer lower end of goal d/t h/o bleeding) Monitor platelets by anticoagulation protocol: Yes   Plan:  Give Heparin bolus of 3500 units x 1 Then start Heparin infusion at 700 units/hr Obtain Heparin level 6 hours after start of infusion F/u on baseline labs Daily CBC  Loma BostonLaura Fontaine Hehl, PharmD Pager: [phone] 11-02-2013 4:22 PM

## 2014-07-15 NOTE — Progress Notes (Signed)
ANTICOAGULATION CONSULT NOTE - Follow Up Consult  Pharmacy Consult for Heparin Infusion Indication: IABP   Allergies  Allergen Reactions  . Penicillins Anaphylaxis    Patient Measurements: Weight 61.2kg (03/25/14) New weight pending  Vital Signs: Temp: 98.2 F (36.8 C) (10/16 1532) Temp Source: Rectal (10/16 1532) BP: 101/65 mmHg (10/16 1652) Pulse Rate: 84 (10/16 1532)  Labs:  Recent Labs  27-Jan-2014 1544 27-Jan-2014 1635  HGB 7.0* 7.1*  HCT 22.6* 21.0*  PLT 273  --   APTT 23*  --   LABPROT 16.9*  --   INR 1.36  --   HEPARINUNFRC <0.10*  --   CREATININE 1.40* 1.40*  TROPONINI 2.71*  --     The CrCl is unknown because both a height and weight (above a minimum accepted value) are required for this calculation.   Medical History: Past Medical History  Diagnosis Date  . Hypertension     Assessment:  [age] yoF with recent h/o rectal bleeding and GI bleeding ulcers - seen and hospitalized [facility]. [facility] with weakness and GI bleed and cardiogenic shock,  EKG is concerning for Acute MI and [facility].  CAD with stents placed LAD and Diagonal, IAPB placed as well.   She is now intubated and sedated.     Asa pr and  angiomax given in lab, aggrastat continued post cath x18hr  Baseline PTT, PT/INR - all wnl, h/h low 7/22.6 - plan for PRBC post cath.    Start low dose heparin post cath for IAPB  Goal of Therapy:  Heparin level 0.2-0.5 Monitor platelets by anticoagulation protocol: Yes   Plan:  Heparin drip 600 uts/hr  Daily Heparin level and CBC   Leota SauersLisa Zacharey Jensen Pharm.D. CPP, BCPS Clinical Pharmacist [phone] 07/07/14 7:28 PM

## 2014-07-15 NOTE — ED Notes (Signed)
Bed: RESA Expected date:  Expected time:  Means of arrival:  Comments: Hold for 21

## 2014-07-15 NOTE — Consult Note (Signed)
PULMONARY  / CRITICAL CARE MEDICINE CONSULTATION   Name: Denise Oconnor MRN: [id number] DOB: Feb 09, 1951    ADMISSION DATE:  2014/07/27 CONSULTATION DATE: Jul 27, 2014  REQUESTING CLINICIAN: Dr. Anne Fu PRIMARY SERVICE: Cardiology  CHIEF COMPLAINT:  STEMI  BRIEF PATIENT DESCRIPTION: 55 F with STEMI and subsequent cardiogenic shock now on balloon pump. PCCM consulted for vent management.  SIGNIFICANT EVENTS / STUDIES:  STEMI 2023-07-28 LAD/D1 DES Jul 28, 2023 Balloon Pump Placement 2023/07/28 Intubation Jul 28, 2023  LINES / TUBES: RIJ 2023-07-28  CULTURES: None  ANTIBIOTICS: None  HISTORY OF PRESENT ILLNESS:  Denise Oconnor is a 89 F with an unclear PMH who presented to [facility] today in cardiogenic shock. The following is obtained from a chart review as the patient is currently unable to provide history. She presented in shock and was rapidly intubated and taken to the cath lab emergently.  PAST MEDICAL HISTORY :  Past Medical History  Diagnosis Date  . Hypertension    Past Surgical History  Procedure Laterality Date  . Colonoscopy  2002   Prior to Admission medications   Medication Sig Start Date End Date Taking? Authorizing Provider  aspirin EC 81 MG tablet Take 81 mg by mouth daily.    Historical Provider, MD  megestrol (MEGACE) 40 MG/ML suspension Take 400 mg by mouth daily.  07/05/14   Historical Provider, MD  pantoprazole (PROTONIX) 40 MG tablet Take 40 mg by mouth daily.  06/30/14   Historical Provider, MD  trandolapril-verapamil (TARKA) 1-240 MG per tablet Take 1 tablet by mouth daily.    Historical Provider, MD  triamterene-hydrochlorothiazide (DYAZIDE) 37.5-25 MG per capsule Take 1 capsule by mouth daily.  06/20/14   Historical Provider, MD  verapamil (CALAN-SR) 180 MG CR tablet Take 180 mg by mouth daily.  07/09/14   Historical Provider, MD   Allergies  Allergen Reactions  . Penicillins Anaphylaxis    FAMILY HISTORY:  No family history on file. SOCIAL HISTORY:  reports that she has been  smoking.  She does not have any smokeless tobacco history on file. She reports that she drinks alcohol. Her drug history is not on file.  REVIEW OF SYSTEMS:  Unable to obtain secondary to patient condition.   SUBJECTIVE:   VITAL SIGNS: Temp:  [98.2 F (36.8 C)] 98.2 F (36.8 C) 07-28-23 1532) Pulse Rate:  [73-84] 73 07/28/2023 1719) Resp:  [18-29] 20 07-28-2023 2000) BP: (62-101)/(40-65) 101/48 mmHg 07/28/23 2000) SpO2:  [98 %-100 %] 100 % 2023-07-28 1652) FiO2 (%):  [100 %] 100 % 2023/07/28 1922) Weight:  [134 lb 7.7 oz (61 kg)] 134 lb 7.7 oz (61 kg) 07/28/23 1700) HEMODYNAMICS:   VENTILATOR SETTINGS: Vent Mode:  [-] PRVC FiO2 (%):  [100 %] 100 % Set Rate:  [20 bmp] 20 bmp Vt Set:  [500 mL] 500 mL PEEP:  [5 cmH20] 5 cmH20 Plateau Pressure:  [26 cmH20] 26 cmH20 INTAKE / OUTPUT: Intake/Output   None     PHYSICAL EXAMINATION: General:  Intubated and sedated Neuro:  Paralyzed, Sedated HEENT:  Sclera anicteric, conjunctiva pink, ETT present with bloody secretions Neck: Large R hematoma present around TLC, trachea midline and supple, (-) LAN Cardiovascular:  Difficult to auscultate heart sounds 2/2 coarse mechanical BS Lungs:  Coarse mechanical BS bilaterally Abdomen:  S/NT/ND/(+)BS Musculoskeletal:  (-) C/C/E Skin:  Intact  LABS:  CBC  Recent Labs Lab 07/27/14 1544 07/27/2014 1635 2014-07-27 2013  WBC 16.0*  --  14.8*  HGB 7.0* 7.1* 8.8*  HCT 22.6* 21.0* 27.9*  PLT 273  --  247   Coag's  Recent Labs Lab 07/14/2014 1544 07/27/2014 2013  APTT 23* 107*  INR 1.36 2.89*   BMET  Recent Labs Lab 07/14/2014 1544 07/11/2014 1635 07/23/2014 2013  NA 139 136* 141  K 3.9 4.3 3.6*  CL 104 105 101  CO2 10*  --  15*  BUN 22 28* 22  CREATININE 1.40* 1.40* 1.67*  GLUCOSE 264* 177* 218*   Electrolytes  Recent Labs Lab 07/08/2014 1544 07/28/2014 2013  CALCIUM 8.6 7.9*   Sepsis Markers  Recent Labs Lab 07/26/2014 1638  LATICACIDVEN 12.98*   ABG  Recent Labs Lab 07/10/2014 1728  07/27/2014 2155  PHART 6.940* 7.188*  PCO2ART 35.9 42.8  PO2ART 157.0* 48.0*   Liver Enzymes  Recent Labs Lab 07/17/2014 1544  AST 77*  ALT 49*  ALKPHOS 51  BILITOT 0.3  ALBUMIN 2.8*   Cardiac Enzymes  Recent Labs Lab 07/11/2014 1544  TROPONINI 2.71*   Glucose No results found for this basename: GLUCAP,  in the last 168 hours  Imaging Dg Chest Port 1 View  07/23/2014   CLINICAL DATA:  Initial a cover for central line placement, history of hypertension  EXAM: PORTABLE CHEST - 1 VIEW  COMPARISON:  None.  FINDINGS: Endotracheal tube tip is 2.4 cm above the carina. There is a right internal jugular central line with tip over the superior vena cava. There is no pneumothorax. NG tube crosses into the stomach. There appears to be an intra-aortic balloon pump just below the level of the aortic arch.  Cardiac silhouette is normal. There is moderate to severe diffuse bilateral alveolar opacification with no pleural effusions.  IMPRESSION: Bilateral alveolar opacification of uncertain origin. Possibilities include pulmonary edema, ARDS, and alveolar hemorrhage.  Lines and tubes as described above.   Electronically Signed   By: Esperanza Heiraymond  Rubner M.D.   On: 07/05/2014 20:48    EKG: Reviewed, ST elevations noted CXR: Personally reviewed. RIJ hematoma present, hyperinflated, diffuse opacities, surprisingly small cardiac silloutte  ASSESSMENT / PLAN:  PULMONARY A: Acute Hypoxic and hypercarbic respiratory failure: Likely a consequence of heart failure. Doubt DAH. ARDS possible.  P:   Lung protective ventilation VAP prevention SBT/Daily Awakening  CARDIOVASCULAR A: Cardiogenic Shock 2/2 STEMI: PVD: HTN:  P:   Management per cardiology Initially epi push given, then added epi drip, vaso during consult as the patient became progressively hypotensive Cards Fellow to evaluate for more aggressive support  RENAL A: Metabolic and Respiratory Acidosis: Likely 2/2 lactic acidosis. ? Acute or  Chronic KD: Hypokalemia: P:   Several Bicarb Pushes given Bicarb Drip Push vent settings Repeat ABG in 15 min Serial BMPs  GASTROINTESTINAL A: Recent GIB: No evidence of active bleeding  HEMATOLOGIC A: Elevated INR: ? Cath lab agents vs. [place] DIC P:   DIC panel  INFECTIOUS A: No acute issue  ENDOCRINE A: Hyperglycemia:  P:   SSI A1c  NEUROLOGIC A: No acute issue  TODAY'S SUMMARY:   I have personally obtained a history, examined the patient, evaluated laboratory and imaging results, formulated the assessment and plan and placed orders.  CRITICAL CARE: The patient is critically ill with multiple organ systems failure and requires high complexity decision making for assessment and support, frequent evaluation and titration of therapies, application of advanced monitoring technologies and extensive interpretation of multiple databases. Critical Care Time devoted to patient care services described in this note is 75 minutes.   Evalyn CascoAdam Bethaney Oshana, MD Pulmonary and Critical Care Medicine [facility] Pager: [phone]  07/28/2014, 10:06 PM

## 2014-07-15 NOTE — ED Notes (Signed)
Bed: WA21 Expected date:  Expected time:  Means of arrival:  Comments: ems 

## 2014-07-15 NOTE — Consult Note (Signed)
Consult Reason for Consult:LLE weakness Referring Physician: Dr Anne FuSkains, cardiology  CC: left lower extremity weakness  HPI: Denise Oconnor is an 63 y.o. female  with general weakness, left leg pain and left leg weakness, hypotension this 60 systolic. Patient recently had ulcer and [facility]. On ED exam patient pale, intermittent agitation, patient denies chest pain shortness of breath or abdominal or back pain. EKG reviewed concerning for heart strain/ST elevation heart attack. Found to be in cardiogenic shock, emergently taken to cath lab, found to have occlusion of LAD with stent placed. Currently intubated on fentanyl and versed gtt so history obtained via notes and RN.   Unclear duration of LLE weakness.   Past Medical History  Diagnosis Date  . Hypertension     Past Surgical History  Procedure Laterality Date  . Colonoscopy  2002    No family history on file.  Social History:  reports that she has been smoking.  She does not have any smokeless tobacco history on file. She reports that she drinks alcohol. Her drug history is not on file.  Allergies  Allergen Reactions  . Penicillins Anaphylaxis    Medications:  Scheduled: . sodium chloride   Intravenous Once  . [START ON 07/06/2014] aspirin  81 mg Per NG tube Daily  . aspirin  300 mg Rectal Once  . atorvastatin  80 mg Per NG tube q1800  . etomidate      . lidocaine (cardiac) 100 mg/65ml      . lidocaine (PF)      . rocuronium      . succinylcholine      . ticagrelor  180 mg Oral Once  . [START ON 07/03/2014] ticagrelor  90 mg Per NG tube BID    ROS: Out of a complete 14 system review, the patient complains of only the following symptoms, and all other reviewed systems are negative.  Physical Examination: Blood pressure 101/48, pulse 73, temperature 98.2 F (36.8 C), temperature source Rectal, resp. rate 20, weight 61 kg (134 lb 7.7 oz), SpO2 100.00%.  Neurologic Examination completed on  versed and fentanyl so exam limited Mental Status: Intubated, non-verbal, not following commands Cranial Nerves: II: pupils equal, round, reactive to light  III,IV, VI: eyes closed, midline V,VII: intubated but no noted facial asymmetry  IX,X: gag reflex present Motor:  No spontaneous movement noted. Did not test for withdrawal to noxious stimuli. Puncture site was right groin Deep Tendon Reflexes: 2+ and symmetric throughout Plantars: Right: downgoing   Left: downgoing Cerebellar: Unable to test due to mental status Gait: unable to test due to mental status  Laboratory Studies:   Basic Metabolic Panel:  Recent Labs Lab 12-02-13 1544 12-02-13 1635  NA 139 136*  K 3.9 4.3  CL 104 105  CO2 10*  --   GLUCOSE 264* 177*  BUN 22 28*  CREATININE 1.40* 1.40*  CALCIUM 8.6  --     Liver Function Tests:  Recent Labs Lab 12-02-13 1544  AST 77*  ALT 49*  ALKPHOS 51  BILITOT 0.3  PROT 6.2  ALBUMIN 2.8*   No results found for this basename: LIPASE, AMYLASE,  in the last 168 hours No results found for this basename: AMMONIA,  in the last 168 hours  CBC:  Recent Labs Lab 12-02-13 1544 12-02-13 1635 12-02-13 2013  WBC 16.0*  --  14.8*  NEUTROABS 10.1*  --   --   HGB 7.0* 7.1* 8.8*  HCT 22.6* 21.0* 27.9*  MCV 93.0  --  89.4  PLT 273  --  247    Cardiac Enzymes:  Recent Labs Lab 07/11/2014 1544  TROPONINI 2.71*    BNP: No components found with this basename: POCBNP,   CBG: No results found for this basename: GLUCAP,  in the last 168 hours  Microbiology: No results found for this or any previous visit.  Coagulation Studies:  Recent Labs  07/22/2014 1544  LABPROT 16.9*  INR 1.36    Urinalysis: No results found for this basename: COLORURINE, APPERANCEUR, LABSPEC, PHURINE, GLUCOSEU, HGBUR, BILIRUBINUR, KETONESUR, PROTEINUR, UROBILINOGEN, NITRITE, LEUKOCYTESUR,  in the last 168 hours  Lipid Panel:  No results found for this basename: chol, trig, hdl,  cholhdl, vldl, ldlcalc    HgbA1C:  No results found for this basename: HGBA1C    Urine Drug Screen:   No results found for this basename: labopia, cocainscrnur, labbenz, amphetmu, thcu, labbarb    Alcohol Level:  Recent Labs Lab 07/13/2014 1544  ETH <11     Imaging: Dg Chest Port 1 View  07/25/2014   CLINICAL DATA:  Initial a cover for central line placement, history of hypertension  EXAM: PORTABLE CHEST - 1 VIEW  COMPARISON:  None.  FINDINGS: Endotracheal tube tip is 2.4 cm above the carina. There is a right internal jugular central line with tip over the superior vena cava. There is no pneumothorax. NG tube crosses into the stomach. There appears to be an intra-aortic balloon pump just below the level of the aortic arch.  Cardiac silhouette is normal. There is moderate to severe diffuse bilateral alveolar opacification with no pleural effusions.  IMPRESSION: Bilateral alveolar opacification of uncertain origin. Possibilities include pulmonary edema, ARDS, and alveolar hemorrhage.  Lines and tubes as described above.   Electronically Signed   By: Esperanza Heiraymond  Rubner M.D.   On: 07/26/2014 20:48     Assessment/Plan:  63 y.o. female  with general weakness, left leg pain and left leg weakness, hypotension to the systolic 60s, found to be in cardiogenic shock, emergently brought to cath lab found to have LAD occlusion with stent placed. Exam limited due to sedation post procedure. We will formally evaluate as mental status improves and allows a more detailed neurological exam.   Elspeth Choeter Sajid Ruppert, DO [facility] [phone]  If 7pm- 7am, please page neurology on call as listed in AMION. 07/08/2014, 8:54 PM

## 2014-07-16 LAB — TYPE AND SCREEN
ABO/RH(D): O POS
Antibody Screen: NEGATIVE
Unit division: 0

## 2014-07-16 LAB — POCT I-STAT 3, VENOUS BLOOD GAS (G3P V)
ACID-BASE DEFICIT: 6 mmol/L — AB (ref 0.0–2.0)
Bicarbonate: 20.1 mEq/L (ref 20.0–24.0)
O2 Saturation: 57 %
TCO2: 22 mmol/L (ref 0–100)
pCO2, Ven: 42.4 mmHg — ABNORMAL LOW (ref 45.0–50.0)
pH, Ven: 7.271 (ref 7.250–7.300)
pO2, Ven: 30 mmHg (ref 30.0–45.0)

## 2014-07-16 MED FILL — Medication: Qty: 1 | Status: AC

## 2014-07-18 LAB — CARBOXYHEMOGLOBIN
METHEMOGLOBIN: 0.5 % (ref 0.0–1.5)
O2 Saturation: 37.5 %
Total hemoglobin: 8.8 g/dL — ABNORMAL LOW (ref 12.0–16.0)

## 2014-07-18 MED FILL — Fentanyl Citrate Inj 0.05 MG/ML: INTRAMUSCULAR | Qty: 2 | Status: AC

## 2014-07-18 MED FILL — Sodium Bicarbonate IV Soln 8.4%: INTRAVENOUS | Qty: 50 | Status: AC

## 2014-07-18 MED FILL — Tirofiban HCl in NaCl IV Soln 25 MG/500ML (Base Equiv): INTRAVENOUS | Qty: 100 | Status: AC

## 2014-07-18 MED FILL — Nitroglycerin IV Soln 100 MCG/ML in D5W: INTRA_ARTERIAL | Qty: 10 | Status: AC

## 2014-07-18 MED FILL — Norepinephrine Bitartrate IV Soln 1 MG/ML (Base Equivalent): INTRAVENOUS | Qty: 4 | Status: AC

## 2014-07-18 MED FILL — Midazolam HCl Inj 2 MG/2ML (Base Equivalent): INTRAMUSCULAR | Qty: 2 | Status: AC

## 2014-07-18 MED FILL — Bivalirudin For IV Soln 250 MG: INTRAVENOUS | Qty: 250 | Status: AC

## 2014-07-18 MED FILL — Sodium Chloride IV Soln 0.9%: INTRAVENOUS | Qty: 50 | Status: AC

## 2014-07-18 MED FILL — Dextrose Inj 5%: INTRAVENOUS | Qty: 250 | Status: AC

## 2014-07-18 MED FILL — Lidocaine HCl Local Preservative Free (PF) Inj 1%: INTRAMUSCULAR | Qty: 30 | Status: AC

## 2014-07-18 MED FILL — Heparin Sodium (Porcine) 2 Unit/ML in Sodium Chloride 0.9%: INTRAMUSCULAR | Qty: 2000 | Status: AC

## 2014-07-20 ENCOUNTER — Telehealth: Payer: Self-pay | Admitting: Cardiology

## 2014-07-20 NOTE — Telephone Encounter (Signed)
D/C rec [facility] [place] has agreed to Sign.

## 2014-07-21 ENCOUNTER — Telehealth: Payer: Self-pay | Admitting: Cardiology

## 2014-07-21 NOTE — Telephone Encounter (Signed)
[facility] aware D/C ready For pick up  10.22.15/km

## 2014-07-31 NOTE — Progress Notes (Signed)
Chaplain followed up with pt family after code blue and passing. Chaplain offer hospitality, provided emotional support, and read scripture. Family grieved healthily and came to a place of peace before leaving. Family does not know funeral arrangements, but patient placement card given by nurse.  07/10/2014 0100  Clinical Encounter Type  Visited With Patient and family together;Health care provider  Visit Type Follow-up  Spiritual Encounters  Spiritual Needs Literature;Emotional;Grief support;Prayer  Stress Factors  Family Stress Factors Loss;Major life changes  Denise Oconnor, Mayer MaskerCourtney F, Chaplain 07/22/2014 1:25 AM

## 2014-07-31 NOTE — Significant Event (Addendum)
Called to evaluate patient due to progressive cardiogenic shock after she received PCI for an acute anterior MI.   At time she left the lab, she was on levophed 20.  Milrinone had been added and levophed was up to 75 when I was called. IABP augmentation was poor, typically only about 60mmHg, and MAPs in 50s-60s despite the above support.   The patient began to rapidly decline and was given multiple pushes of epinephrine with good effect. Epi drip and vasopressin started. She was given multiple amps of bicarb. TTE demonstrated EF ~20-25% with akinesis of the septum, anterior, and apical regions. Posterior and lateral wall function was okay, albeit on high dose vasopressors. No effusion.   I contacted Dr. Tresa EndoKelly, the on call interventionalist, to discuss increased mechanical support with an impella. We continued to augment pharmacologic support and Dr. Tresa EndoKelly came to the hospital to review the films from today to determine whether her iliacs would accommodate an impella device.   At 11:05pm, Ms. Vonita MossPeterson coded (PEA). She was supported with multiple rounds of epinephrine, bicarb, calcium. During the code Dr. Tresa EndoKelly notified us that her LE vasculature would not support an impella device and so we were left without options to augment her mechanical support. During progressive rhythm checks she was noted to be either asystolic or with an agonal bradycardic rhythm.  Due to her condition and lack of options for augmenting support if ROSC were to be obtained briefly, I called the code at 11:17pm due to futility.   I spoke with the family at length before and after the code. A chaplain was present for our discussion fo Ms. Ozer's passing. All questions were answered.

## 2014-07-31 NOTE — Progress Notes (Signed)
CRITICAL VALUE ALERT  Critical value received:  pH  Date of notification:  May 08, 2014  Time of notification:  2155  Critical value read back:Yes.    Nurse who received alert:  Luther ParodyKryestyn Matherly, RN  MD notified: Dr. Curt BearsBelanger at bedside- orders received.

## 2014-07-31 NOTE — ED Provider Notes (Addendum)
Medical screening examination/treatment/procedure(s) were conducted as a shared visit with non-physician practitioner(s) or resident and myself. I personally evaluated the patient during the encounter and agree with the findings.  I have personally reviewed any xrays and/ or EKG's with the provider and I agree with interpretation.  This 63 year old female presented with general weakness, left leg pain and left leg weakness, hypotension this 60 systolic. Patient recently had ulcer and [facility]. On exam patient pale, intermittent agitation, patient denies chest pain shortness of breath or abdominal or back pain. EKG reviewed concerning for heart strain/ST elevation heart attack. Complicated presentation with recent GI bleeding. Spoke with neurology and cardiology immediately after assessment. Discussed with neurology likely related to hypotension however with left leg weakness on exam if they could assess once patient is improved clinically. Spoke with cardiology initially regarding bring patient to cath lab with bedside ultrasound showing decreased wall motion and ejection fraction, IVC dilated concern for cardiogenic shock/heart attack. Difficult decision in terms of starting blood thinners due to recent bleeding, hemoglobin delayed and pending as patient difficult IV. Ultrasound IV placed by myself.  EMERGENCY DEPARTMENT US CARDIAC EXAM  "Study: Limited Ultrasound of the heart and pericardium"  INDICATIONS:Hypotension  Multiple views of the heart and pericardium were obtained in real-time with a multi-frequency probe.  PERFORMED ZO:XWRUEABY:Myself  IMAGES ARCHIVED?: Yes  FINDINGS: No pericardial effusion, Decreased contractility, IVC dilated and Tamponade physiology absent  LIMITATIONS: Emergent procedure  VIEWS USED: Subcostal 4 chamber, Parasternal long axis, Parasternal short axis and Inferior Vena Cava  INTERPRETATION: Cardiac activity present, Pericardial effusioin absent, Cardiac  tamponade absent, Probable elevated CVP and Decreased contractility  Emergency Ultrasound Study:  Angiocath insertion Performed by: Enid SkeensZAVITZ, Trindon Dorton M  Consent: Verbal consent obtained. Risks and benefits: risks, benefits and alternatives were discussed Immediately prior to procedure the correct patient, procedure, equipment, support staff and site/side marked as needed.  Indication: difficult IV access  Preparation: Patient was prepped and draped in the usual sterile fashion.  Vein Location: left basilic vein was visualized during assessment for potential access sites and was found to be patent/ easily compressed with linear ultrasound. The needle was visualized with real-time ultrasound and guided into the vein.  Gauge: 20 g  Image saved and stored.  Normal blood return. Patient tolerance: Patient tolerated the procedure well with no immediate complications. EMERGENCY DEPARTMENT ULTRASOUND  Study: Limited Retroperitoneal Ultrasound of the Abdominal Aorta.  INDICATIONS:Hypotension and Age>55  Multiple views of the abdominal aorta were obtained in real-time from the diaphragmatic hiatus to the aortic bifurcation in transverse planes with a multi-frequency probe.  PERFORMED BY: Myself  IMAGES ARCHIVED?: Yes  FINDINGS: Maximum aortic dimensions are < 2 cm  LIMITATIONS: Bowel gas  INTERPRETATION: No abdominal aortic aneurysm  \  CRITICAL CARE Performed by: Enid SkeensZAVITZ, Temple Ewart M  Total critical care time: 90 min  Critical care time was exclusive of separately billable procedures and treating other patients.  Critical care was necessary to treat or prevent imminent or life-threatening deterioration.  Critical care was time spent personally by me on the following activities: development of treatment plan with patient and/or surrogate as well as nursing, discussions with consultants, evaluation of patient's response to treatment, examination of patient, obtaining history from patient or surrogate,  ordering and performing treatments and interventions, ordering and review of laboratory studies, ordering and review of radiographic studies, pulse oximetry and re-evaluation of patient's condition.  Endotracheal intubation  Indication respiratory distress, hypotension, agitation  7.5 PTT abuse, one attempt,  MAC 3 blade utilized  Equal breath sounds bilateral after, color change noted  Airway secured prior to transport  Performed by myself  Ultrasound limited abdominal and limited transthoracic ultrasound (FAST)  Indication: hypotension  Four views were obtained using the low frequency transducer: Splenorenal, Hepatorenal, Retrovesical, Pericardial subxyphoid  Interpretation: No free fluid visualized surrounding the kidneys, pelvis or pericardium.  Images archived electronically  Dr. Jodi MourningZavitz personally performed and interpreted the images  Patient had 2 L IV fluids given for hypotension, blood pending.  Patient started become agitated and shortness of breath, IV fluids held and stat O- blood ordered.  3 IVs in place. Cardiology assisted in evaluating at the bedside and agreed with sending over to the Cath Lab for code STEMI for cardiogenic shock. Discussed initially critical care, no beds currently however will assess after Cath Lab.  Push doses of epinephrine followed by norepinephrine given during resuscitation. Blood pressure improved to 100 systolic, patient intubated emergently for dyspnea, agitation and further treatment.  [facility].  Lactic acidosis, cardiogenic shock, ST elevation MI, hypotension, altered mental status, left leg weakness  EKG reviewed heart rate 90, sinus rhythm, poor baseline however ST elevation V3 V4 V5 V6, 1 and aVL. Concern for acute infarct versus diffuse coronary artery disease, QTC mild prolonged QT ST elevation, code STEMI called after discussion with cardiology  Enid SkeensJoshua M Ashton Sabine, MD 07/21/2014 0005  Enid SkeensJoshua  M Donneisha Beane, MD 07/07/2014 [phone]

## 2014-07-31 NOTE — Progress Notes (Signed)
Versed 8mg  wasted down sink and fentanyl 230ml wasted down sink with Okey DupreJennifer Parrish, Rn

## 2014-07-31 DEATH — deceased

## 2014-08-18 NOTE — Discharge Summary (Signed)
    Death note/discharge summary:  Time of death 23:17, date 07/06/2014  Diagnosis: Shock/cardiogenic/septic  Secondary diagnosis: Severe metabolic acidosis, peripheral vascular disease, recent GI bleed, coronary artery disease, ST elevation myocardial infarction.  Hospital course: 63 year old female with recent evaluation for rectal bleeding who [facility] with what appeared to be cardiogenic shock with anterior lateral ST segment elevation myocardial infarction with, located medical history including peripheral vascular disease.  During initial presentation, she was found to have cardiogenic shock/septic shock requiring emergent intubation for acute respiratory failure in the setting of respiratory acidosis. Orlene ErmConversation was had with family member about the gravity of the situation, severe illness [facility]. She was brought emergently to the cardiac catheterization lab where Dr. SwazilandJordan demonstrated severe single-vessel obstructive coronary artery disease with successful bifurcation stenting of LAD and second diagonal with drug-eluting stent 2. Intra-aortic balloon pump was placed secondary to cardiogenic shock. LV systolic pressures were as low as 63/37. Bicarbonate was administered emergently throughout cardiac catheterization.  Following catheterization on central venous catheter was placed by Dr. Gala RomneyBensimhon in the coronary care unit. Co-ox was 37%.  Milrinone was added to the levophed and family was updated once again of grave/critical condition.  Neurology also evaluated patient secondary to previously described left lower extremity weakness.   Critical care medicine also was consulted. Acute hypoxic and hypercarbic respiratory failure noted, ventilator management, multiple inotropic/pressure support, cardiology at bedside.  CODE BLUE was called, CPR, bicarbonate and other advanced life support measures were administered. Chaplain  was present. Patient expired following heroic measures.   Donato SchultzSKAINS, MARK, MD

## 2014-09-01 ENCOUNTER — Telehealth: Payer: Self-pay | Admitting: Cardiology

## 2014-09-01 NOTE — Telephone Encounter (Signed)
Daughter Denise Oconnor visited office for Attending Physician Statement from [employer] to be completed and signed by Dr SwazilandJordan.  Forms were [facility] to [facility] [place] for completion.  Sent forms/roi/pmt to [employer] on 09/01/14. lp

## 2014-09-08 ENCOUNTER — Telehealth: Payer: Self-pay | Admitting: Cardiology

## 2014-09-08 ENCOUNTER — Encounter (HOSPITAL_COMMUNITY): Payer: Self-pay | Admitting: Cardiology

## 2014-09-08 NOTE — Telephone Encounter (Signed)
12.10.15  Received [employer] Attending Physician Statement back from [facility] [place]  Given to Shanda Bumps Pugh, RN for Dr SwazilandJordan for him to review and sign.  lp

## 2014-09-09 NOTE — Telephone Encounter (Signed)
Dr.Jordan signed form.Form returned to [employer] in medical records.

## 2014-09-12 ENCOUNTER — Telehealth: Payer: Self-pay | Admitting: Cardiology

## 2014-09-12 NOTE — Telephone Encounter (Signed)
Received signed [employer] Attending Physician Statement from Dr SwazilandJordan.  Contacted patient that forms were ready to pick up.  lp

## 2015-01-12 IMAGING — CR DG CHEST 1V PORT
1 series · 1 of 1 positions shown · non-contrast
Comparison: None.

CLINICAL DATA: Initial a cover for central line placement, history
of hypertension

EXAM:
PORTABLE CHEST - 1 VIEW

[AP]
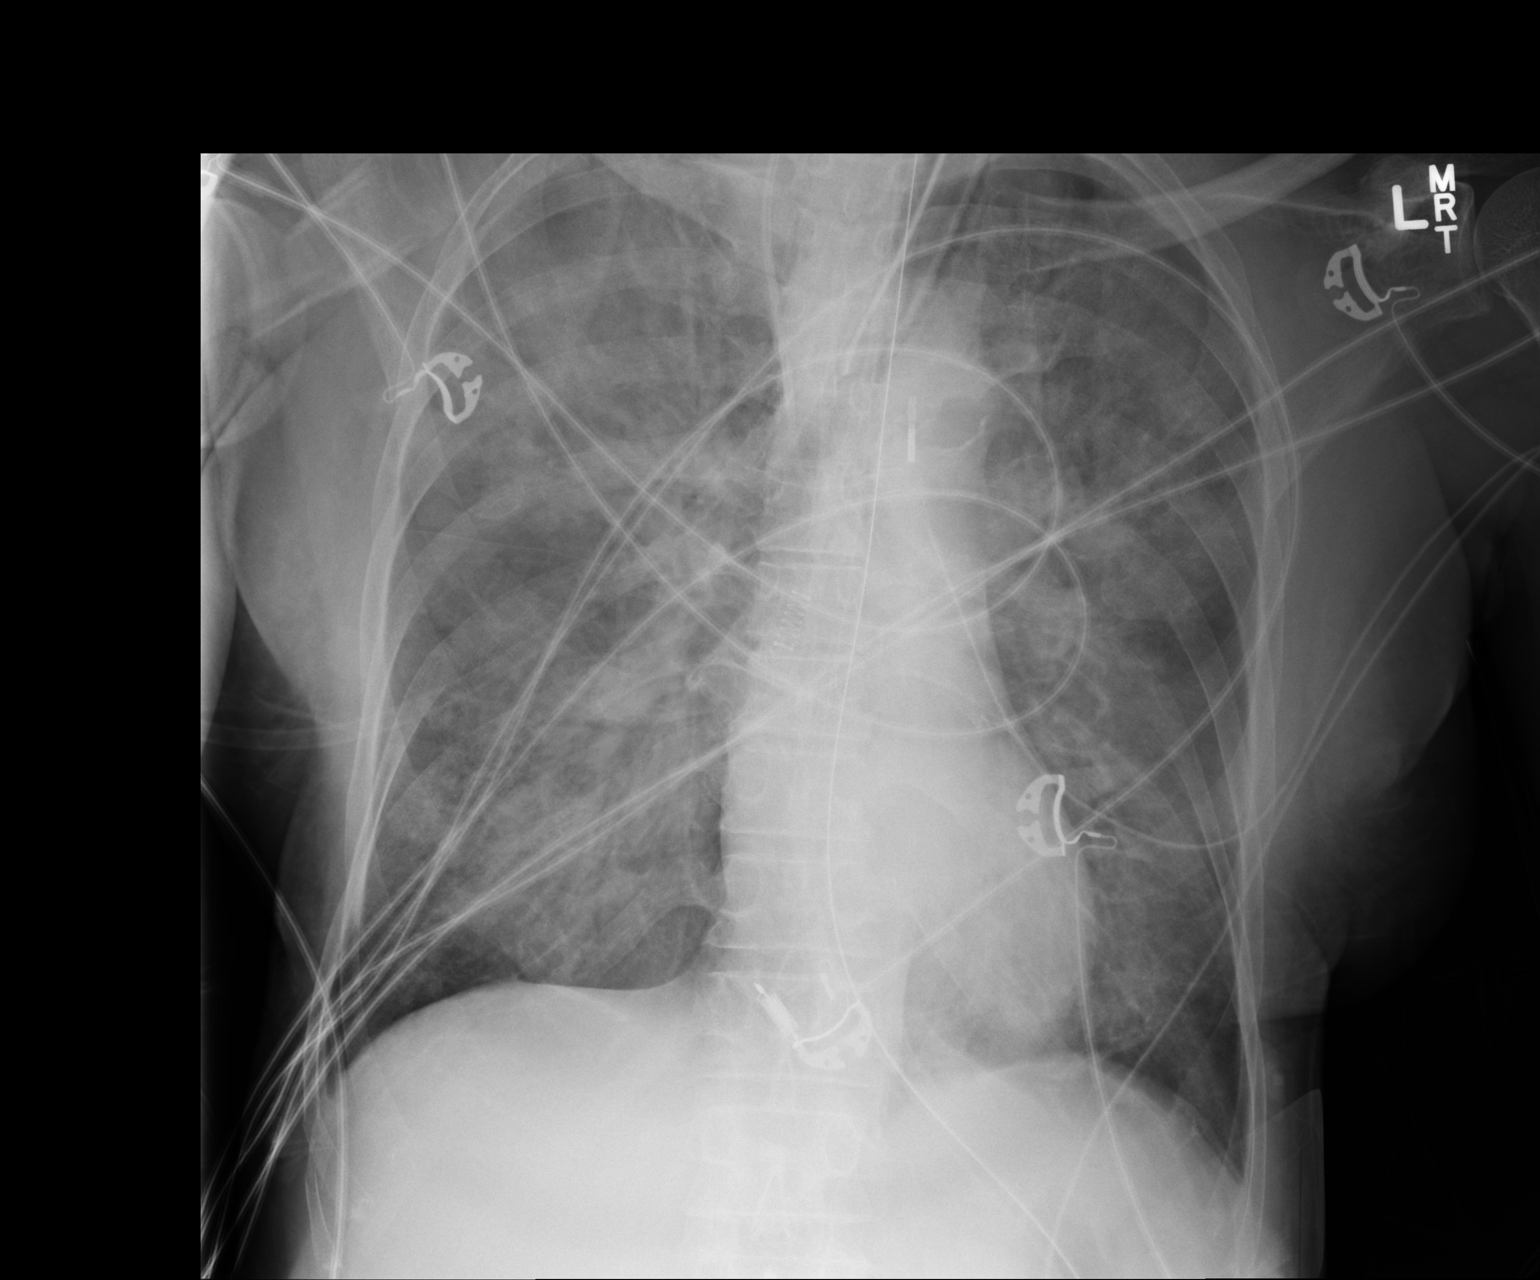

[1 of 1 positions shown; findings below may reference images not displayed]

FINDINGS: Endotracheal tube tip is 2.4 cm above the carina. There is a right
internal jugular central line with tip over the superior vena cava.
There is no pneumothorax. NG tube crosses into the stomach. There
appears to be an intra-aortic balloon pump just below the level of
the aortic arch.

Cardiac silhouette is normal. There is moderate to severe diffuse
bilateral alveolar opacification with no pleural effusions.
IMPRESSION: Bilateral alveolar opacification of uncertain origin. Possibilities
include pulmonary edema, ARDS, and alveolar hemorrhage.

Lines and tubes as described above.
